# Patient Record
Sex: Female | Born: 1985 | Race: Black or African American | Hispanic: No | Marital: Single | Smoking: Never smoker
Health system: Southern US, Community
[De-identification: ages and names within clinical notes are randomized; demographics above are authoritative.]

---

## 2010-10-09 ENCOUNTER — Other Ambulatory Visit (HOSPITAL_COMMUNITY)
Admission: RE | Admit: 2010-10-09 | Discharge: 2010-10-09 | Disposition: A | Discharge status: Home / Self Care | Payer: 59 | Source: Ambulatory Visit | Attending: Obstetrics and Gynecology | Admitting: Obstetrics and Gynecology

## 2010-10-09 DIAGNOSIS — Z01419 Encounter for gynecological examination (general) (routine) without abnormal findings: Secondary | ICD-10-CM | POA: Insufficient documentation

## 2010-10-09 DIAGNOSIS — Z113 Encounter for screening for infections with a predominantly sexual mode of transmission: Secondary | ICD-10-CM | POA: Insufficient documentation

## 2011-11-13 ENCOUNTER — Other Ambulatory Visit (HOSPITAL_COMMUNITY)
Admission: RE | Admit: 2011-11-13 | Discharge: 2011-11-13 | Disposition: A | Discharge status: Home / Self Care | Payer: 59 | Source: Ambulatory Visit | Attending: Obstetrics and Gynecology | Admitting: Obstetrics and Gynecology

## 2011-11-13 DIAGNOSIS — Z113 Encounter for screening for infections with a predominantly sexual mode of transmission: Secondary | ICD-10-CM | POA: Insufficient documentation

## 2011-11-13 DIAGNOSIS — Z01419 Encounter for gynecological examination (general) (routine) without abnormal findings: Secondary | ICD-10-CM | POA: Insufficient documentation

## 2012-09-03 ENCOUNTER — Encounter (HOSPITAL_COMMUNITY): Payer: Self-pay | Admitting: *Deleted

## 2012-09-03 ENCOUNTER — Emergency Department (HOSPITAL_COMMUNITY)
Admission: EM | Admit: 2012-09-03 | Discharge: 2012-09-03 | Disposition: A | Discharge status: Home / Self Care | Payer: 59 | Attending: Emergency Medicine | Admitting: Emergency Medicine

## 2012-09-03 DIAGNOSIS — G8929 Other chronic pain: Secondary | ICD-10-CM

## 2012-09-03 DIAGNOSIS — R110 Nausea: Secondary | ICD-10-CM | POA: Insufficient documentation

## 2012-09-03 DIAGNOSIS — Z3202 Encounter for pregnancy test, result negative: Secondary | ICD-10-CM | POA: Insufficient documentation

## 2012-09-03 DIAGNOSIS — R141 Gas pain: Secondary | ICD-10-CM | POA: Insufficient documentation

## 2012-09-03 DIAGNOSIS — J3489 Other specified disorders of nose and nasal sinuses: Secondary | ICD-10-CM | POA: Insufficient documentation

## 2012-09-03 DIAGNOSIS — R1084 Generalized abdominal pain: Secondary | ICD-10-CM | POA: Insufficient documentation

## 2012-09-03 DIAGNOSIS — R142 Eructation: Secondary | ICD-10-CM | POA: Insufficient documentation

## 2012-09-03 LAB — URINALYSIS, ROUTINE W REFLEX MICROSCOPIC
Bilirubin Urine: NEGATIVE
Glucose, UA: NEGATIVE mg/dL
Hgb urine dipstick: NEGATIVE
Ketones, ur: 15 mg/dL — AB
Nitrite: NEGATIVE
Protein, ur: NEGATIVE mg/dL
Specific Gravity, Urine: 1.026 (ref 1.005–1.030)
Urobilinogen, UA: 1 mg/dL (ref 0.0–1.0)
pH: 6 (ref 5.0–8.0)

## 2012-09-03 LAB — COMPREHENSIVE METABOLIC PANEL WITH GFR
ALT: 12 U/L (ref 0–35)
Alkaline Phosphatase: 90 U/L (ref 39–117)
CO2: 29 meq/L (ref 19–32)
Chloride: 102 meq/L (ref 96–112)
GFR calc Af Amer: >90 mL/min (ref >90)
GFR calc non Af Amer: >90 mL/min (ref >90)
Glucose, Bld: 72 mg/dL (ref 70–99)
Potassium: 4.1 meq/L (ref 3.5–5.1)
Sodium: 140 meq/L (ref 135–145)
Total Bilirubin: 0.4 mg/dL (ref 0.3–1.2)

## 2012-09-03 LAB — COMPREHENSIVE METABOLIC PANEL
AST: 20 U/L (ref 0–37)
Albumin: 4.1 g/dL (ref 3.5–5.2)
BUN: 8 mg/dL (ref 6–23)
Calcium: 9.7 mg/dL (ref 8.4–10.5)
Creatinine, Ser: 0.67 mg/dL (ref 0.50–1.10)
Total Protein: 7.7 g/dL (ref 6.0–8.3)

## 2012-09-03 LAB — CBC WITH DIFFERENTIAL/PLATELET
Basophils Absolute: 0 10*3/uL (ref 0.0–0.1)
Basophils Relative: 0 % (ref 0–1)
Eosinophils Absolute: 0.2 10*3/uL (ref 0.0–0.7)
Eosinophils Relative: 4 % (ref 0–5)
HCT: 39.1 % (ref 36.0–46.0)
Hemoglobin: 13.4 g/dL (ref 12.0–15.0)
Lymphocytes Relative: 46 % (ref 12–46)
Lymphs Abs: 2.2 K/uL (ref 0.7–4.0)
MCH: 28.8 pg (ref 26.0–34.0)
MCHC: 34.3 g/dL (ref 30.0–36.0)
MCV: 84.1 fL (ref 78.0–100.0)
Monocytes Absolute: 0.4 10*3/uL (ref 0.1–1.0)
Monocytes Relative: 9 % (ref 3–12)
Neutro Abs: 2 10*3/uL (ref 1.7–7.7)
Neutrophils Relative %: 42 % — ABNORMAL LOW (ref 43–77)
Platelets: 299 K/uL (ref 150–400)
RBC: 4.65 MIL/uL (ref 3.87–5.11)
RDW: 13.3 % (ref 11.5–15.5)
WBC: 4.8 K/uL (ref 4.0–10.5)

## 2012-09-03 LAB — URINE MICROSCOPIC-ADD ON

## 2012-09-03 LAB — POCT PREGNANCY, URINE: Preg Test, Ur: NEGATIVE

## 2012-09-03 LAB — LIPASE, BLOOD: Lipase: 43 U/L (ref 11–59)

## 2012-09-03 NOTE — ED Notes (Addendum)
Pt asking how much longer that she needs to wait she would like to go down to the cafeteria because she is hungry. Pt informed not to eat until ok'd by the doctor.

## 2012-09-03 NOTE — ED Notes (Signed)
Pt states has seen multiple doctors urgent cares for abdominal pain, told she may have fibroids due to the way her stomach is shaped. Pt reports bilateral pain to abdomen x this week. Reports back pain, nausea.

## 2012-09-03 NOTE — ED Provider Notes (Signed)
I reviewed the case with the resident, reviewed the resident's note and I agree with the findings and plan.  Hurman Horn, MD 09/03/12 2149

## 2012-09-03 NOTE — ED Provider Notes (Signed)
History     CSN: [id number]  Arrival date & time 09/03/12  1018   First MD Initiated Contact with Patient 09/03/12 1127      Chief Complaint  Patient presents with  . Abdominal Pain    (Consider location/radiation/quality/duration/timing/severity/associated sxs/prior treatment) Patient is a 27 y.o. female presenting with abdominal pain. The history is provided by the patient. No language interpreter was used.  Abdominal Pain Pain location:  Generalized Pain quality: cramping   Pain radiates to:  Does not radiate Pain severity:  Moderate Onset quality:  Gradual Duration: 2-3 days occuring about monthly. Timing:  Intermittent Progression:  Waxing and waning Chronicity:  Chronic (5-6 years) Context: not diet changes, not eating, not laxative use, not recent illness, not recent sexual activity, not retching, not sick contacts, not suspicious food intake and not trauma   Relieved by:  Nothing Worsened by:  Nothing tried Ineffective treatments:  None tried Associated symptoms: nausea   Associated symptoms: no anorexia, no chest pain, no chills, no constipation, no cough, no diarrhea, no dysuria, no fatigue, no fever, no shortness of breath, no sore throat, no vaginal bleeding, no vaginal discharge and no vomiting   Risk factors: no alcohol abuse, no aspirin use, not elderly, has not had multiple surgeries, no NSAID use, not obese, not pregnant and no recent hospitalization     History reviewed. No pertinent past medical history.  History reviewed. No pertinent past surgical history.  No family history on file.  History  Substance Use Topics  . Smoking status: Never Smoker   . Smokeless tobacco: Not on file  . Alcohol Use: No    OB History   Grav Para Term Preterm Abortions TAB SAB Ect Mult Living                  Review of Systems  Constitutional: Negative for fever, chills, diaphoresis, activity change, appetite change and fatigue.  HENT: Negative for congestion,  sore throat, facial swelling, rhinorrhea, neck pain and neck stiffness.   Eyes: Negative for photophobia and discharge.  Respiratory: Negative for cough, chest tightness and shortness of breath.   Cardiovascular: Negative for chest pain, palpitations and leg swelling.  Gastrointestinal: Positive for nausea and abdominal pain. Negative for vomiting, diarrhea, constipation and anorexia.  Endocrine: Negative for polydipsia and polyuria.  Genitourinary: Negative for dysuria, frequency, vaginal bleeding, vaginal discharge, difficulty urinating and pelvic pain.  Musculoskeletal: Negative for back pain and arthralgias.  Skin: Negative for color change and wound.  Allergic/Immunologic: Negative for immunocompromised state.  Neurological: Negative for facial asymmetry, weakness, numbness and headaches.  Hematological: Does not bruise/bleed easily.  Psychiatric/Behavioral: Negative for confusion and agitation.    Allergies  Review of patient's allergies indicates no known allergies.  Home Medications   Current Outpatient Rx  Name  Route  Sig  Dispense  Refill  . clindamycin (CLEOCIN) 150 MG capsule   Oral   Take 150 mg by mouth 3 (three) times daily.         . Pseudoeph-Doxylamine-DM-APAP (NYQUIL PO)   Oral   Take 30 mLs by mouth once.           BP 127/84  Pulse 81  Temp(Src) 98 F (36.7 C) (Oral)  Resp 16  SpO2 100%  Physical Exam  Constitutional: She is oriented to person, place, and time. She appears well-developed and well-nourished. No distress.  HENT:  Head: Normocephalic and atraumatic.  Mouth/Throat: No oropharyngeal exudate.  Eyes: Pupils are equal, round, and reactive  to light.  Neck: Normal range of motion. Neck supple.  Cardiovascular: Normal rate, regular rhythm and normal heart sounds.  Exam reveals no gallop and no friction rub.   No murmur heard. Pulmonary/Chest: Effort normal and breath sounds normal. No respiratory distress. She has no wheezes. She has no  rales.  Abdominal: Soft. Bowel sounds are normal. She exhibits distension. She exhibits no mass. There is no tenderness. There is no rebound and no guarding.  Musculoskeletal: Normal range of motion. She exhibits no edema and no tenderness.  Neurological: She is alert and oriented to person, place, and time.  Skin: Skin is warm and dry.  Psychiatric: She has a normal mood and affect.    ED Course  Procedures (including critical care time)  Labs Reviewed  URINALYSIS, ROUTINE W REFLEX MICROSCOPIC - Abnormal; Notable for the following:    APPearance CLOUDY (*)    Ketones, ur 15 (*)    Leukocytes, UA SMALL (*)    All other components within normal limits  CBC WITH DIFFERENTIAL - Abnormal; Notable for the following:    Neutrophils Relative 42 (*)    All other components within normal limits  URINE MICROSCOPIC-ADD ON - Abnormal; Notable for the following:    Squamous Epithelial / LPF FEW (*)    Crystals CA OXALATE CRYSTALS (*)    All other components within normal limits  COMPREHENSIVE METABOLIC PANEL  LIPASE, BLOOD  POCT PREGNANCY, URINE   No results found.   1. Abdominal pain, chronic, generalized       MDM   Pt is a 27 y.o. female with no pertinent PMHX who presents with 5-6 years of intermittent crampy generalized abdominal pain usually w/o associated symptoms, but pt had nausea yesterday, rhinorrhea & congestion 2 days ago, and low back pain last night.  No symptoms currently.  Pt states she came for and [place] see if she had fibroids.  She was sent by [facility], but had also called her gynecologist today and did not feel her wait time was acceptable for an appointment.  On PE, VSS, pt in NAD.  Abdomen is protuberant, but non tender.  CBC, CMP, lipase, UA and POC preg unremarkable.  I doubt acute medical emergency or surgical condition.  Pt may indeed have fibroids, but I believe w/u is safe to continue as an outpt.  Appointment made in 1 week.  Return precautions given for new or  worsening symptoms.     1. Abdominal pain, chronic, generalized      Labs and imaging considered in decision making, reviewed by myself.  Imaging interpreted by radiology. Pt care discussed with my attending, Dr. Fonnie Jarvis.         Toy Cookey, MD 09/03/12 [phone]

## 2012-09-03 NOTE — ED Provider Notes (Addendum)
I saw and evaluated the patient, reviewed the resident's note and I agree with the findings and plan.  Patient with prior history of gynecologic issues reports in acute on chronic discomfort in the lower abdomen and other concerns. She contacted her usual OB/GYN with fecal and was told that there were no availability until next week but did not suggest on where she should go next. Her father suggested that she go to the [facility] urgent care who apparently redirected her here to the emergency department. I reviewed [facility] notes and fine no notes from the urgent care time unsure exactly what the clinical suspicions were. The patient has a soft abdomen with no guarding or rebound. She reports her pain is improved with time. She denies fever or chills, vaginal bleeding or discharge. I feel the patient can be scheduled for an outpatient ultrasound and can followup with her OB/GYN next week. I suggested that she can return if symptoms worsen or she could go to [facility] as many clinic for further more urgent evaluation if she so desired. Patient and family understand.  I spoke to APP provider at [facility] to inform her of patient going to [facility] from here most likely.  Gavin Pound. Oletta Lamas, MD 09/03/12 1326  Gavin Pound. Ghim, MD 09/03/12 1327

## 2012-09-03 NOTE — ED Notes (Signed)
Verified with MD that pt needs to follow up with appointment and not go straight to [facility] ED. Pt alert and mentating appropriately upon d/c. Pt given d/c teaching and follow up care. Pt verbalizes understanding has no further questions upon d/c. NAD noted upon d/c. Pt ambulatory leaving with d/c teaching.

## 2013-01-08 ENCOUNTER — Encounter (HOSPITAL_COMMUNITY): Payer: Self-pay | Admitting: *Deleted

## 2013-01-08 ENCOUNTER — Emergency Department (HOSPITAL_COMMUNITY)
Admission: EM | Admit: 2013-01-08 | Discharge: 2013-01-08 | Disposition: A | Discharge status: Home / Self Care | Payer: No Typology Code available for payment source | Attending: Emergency Medicine | Admitting: Emergency Medicine

## 2013-01-08 DIAGNOSIS — Y92410 Unspecified street and highway as the place of occurrence of the external cause: Secondary | ICD-10-CM | POA: Insufficient documentation

## 2013-01-08 DIAGNOSIS — R5381 Other malaise: Secondary | ICD-10-CM | POA: Insufficient documentation

## 2013-01-08 DIAGNOSIS — Y9389 Activity, other specified: Secondary | ICD-10-CM | POA: Insufficient documentation

## 2013-01-08 MED ORDER — CYCLOBENZAPRINE HCL 10 MG PO TABS: 10.0000 mg | ORAL_TABLET | Freq: Two times a day (BID) | ORAL | Status: DC | PRN

## 2013-01-08 MED ORDER — TRAMADOL HCL 50 MG PO TABS: 50.0000 mg | ORAL_TABLET | Freq: Four times a day (QID) | ORAL | Status: DC | PRN
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
## 2013-01-12 NOTE — ED Provider Notes (Signed)
Medical screening examination/treatment/procedure(s) were performed by non-physician practitioner and as supervising physician I was immediately available for consultation/collaboration.    Jayvian Escoe J. Kirsty Monjaraz, MD 01/12/13 1556 

## 2013-01-14 ENCOUNTER — Emergency Department (HOSPITAL_COMMUNITY)
Admission: EM | Admit: 2013-01-14 | Discharge: 2013-01-14 | Disposition: A | Discharge status: Home / Self Care | Payer: Self-pay | Attending: Emergency Medicine | Admitting: Emergency Medicine

## 2013-01-14 ENCOUNTER — Emergency Department (HOSPITAL_COMMUNITY): Payer: Self-pay

## 2013-01-14 ENCOUNTER — Encounter (HOSPITAL_COMMUNITY): Payer: Self-pay | Admitting: Emergency Medicine

## 2013-01-14 DIAGNOSIS — J029 Acute pharyngitis, unspecified: Secondary | ICD-10-CM | POA: Insufficient documentation

## 2013-01-14 DIAGNOSIS — R1110 Vomiting, unspecified: Secondary | ICD-10-CM | POA: Insufficient documentation

## 2013-01-14 DIAGNOSIS — J069 Acute upper respiratory infection, unspecified: Secondary | ICD-10-CM | POA: Insufficient documentation

## 2013-01-14 MED ORDER — ACETAMINOPHEN 325 MG PO TABS
650.0000 mg | ORAL_TABLET | Freq: Once | ORAL | Status: AC
Start: 2013-01-14 — End: 2013-01-14
  Administered 2013-01-14: 650 mg via ORAL
  Filled 2013-01-14: qty 2

## 2013-01-14 MED ORDER — BENZONATATE 100 MG PO CAPS: 100.0000 mg | ORAL_CAPSULE | Freq: Three times a day (TID) | ORAL | Status: DC

## 2013-01-14 NOTE — ED Provider Notes (Signed)
Medical screening examination/treatment/procedure(s) were performed by non-physician practitioner and as supervising physician I was immediately available for consultation/collaboration.  Miner Koral R. Bassem Bernasconi, MD 01/14/13 2346 
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
## 2013-01-14 NOTE — ED Provider Notes (Signed)
History  This chart was scribed for [employer] working with Juliet Rude. Pickering, MD. This patient was seen in room WTR7/WTR7 and the patient's care was started at 7:59 PM.  CSN: [id number]  Arrival date & time 01/14/13  1957   Chief Complaint  Patient presents with  . Cough    Patient is a 27 y.o. female presenting with cough. The history is provided by the patient. No language interpreter was used.  Cough Cough characteristics:  Productive Sputum characteristics:  Clear (thick) Severity:  Moderate Onset quality:  Gradual Duration:  4 days Timing:  Intermittent Progression:  Worsening Chronicity:  New Smoker: no (exposed to second-hand smoke)   Context: sick contacts and smoke exposure   Relieved by:  None tried Worsened by:  Nothing tried Ineffective treatments:  None tried Associated symptoms: sore throat   Associated symptoms: no chest pain, no chills, no diaphoresis, no fever, no headaches, no myalgias, no rash, no rhinorrhea, no shortness of breath and no wheezing   Sore throat:    Severity:  Mild   Onset quality:  Gradual   Duration:  4 days   Timing:  Constant   Progression:  Unchanged  HPI Comments: Denise Mccoy is a 27 y.o. female without significant PMH brought by EMS from [facility] to the Emergency Department complaining of a cough of 4 days duration. She states that her cough is productive of clear, thick mucus. Denies hemoptysis.  She reports an associated sore throat and post-tussive emesis. Pt states that she lives in a homeless shelter and many others whom she has been in close proximity to have had similar symptoms. Pt denies taking OTC medications at home to improve symptoms. Pt denies fever, SOB, congestion, rhinorrhea, headaches or any other symptoms. Pt denies alcohol use and denies any hx of smoking.  PCP- None  History reviewed. No pertinent past medical history.  History reviewed. No pertinent past surgical history.  No family history  on file.  History  Substance Use Topics  . Smoking status: Never Smoker   . Smokeless tobacco: Not on file  . Alcohol Use: No   OB History   Grav Para Term Preterm Abortions TAB SAB Ect Mult Living                 Review of Systems  Constitutional: Negative for fever, chills and diaphoresis.  HENT: Positive for sore throat. Negative for congestion and rhinorrhea.   Respiratory: Positive for cough. Negative for shortness of breath and wheezing.   Cardiovascular: Negative for chest pain.  Musculoskeletal: Negative for myalgias.  Skin: Negative for rash.  Neurological: Negative for headaches.  All other systems reviewed and are negative.    Allergies  Review of patient's allergies indicates no known allergies.  Home Medications   Current Outpatient Rx  Name  Route  Sig  Dispense  Refill  . cyclobenzaprine (FLEXERIL) 10 MG tablet   Oral   Take 1 tablet (10 mg total) by mouth 2 (two) times daily as needed for muscle spasms.   20 tablet   0   . traMADol (ULTRAM) 50 MG tablet   Oral   Take 1 tablet (50 mg total) by mouth every 6 (six) hours as needed for pain.   15 tablet   0    Triage Vitals: BP 142/94  Pulse 111  Temp(Src) 98.3 F (36.8 C) (Oral)  Resp 20  Wt 150 lb (68.04 kg)  SpO2 97%  Physical Exam  Nursing note and  vitals reviewed. Constitutional: She is oriented to person, place, and time. She appears well-developed and well-nourished. No distress.  HENT:  Head: Normocephalic and atraumatic.  Right Ear: External ear normal.  Left Ear: External ear normal.  Nose: Nose normal.  Mouth/Throat: Oropharynx is clear and moist. No oropharyngeal exudate.  Nasal mucosal edema.  Eyes: EOM are normal.  Neck: Normal range of motion.  Cardiovascular: Normal rate, regular rhythm and normal heart sounds.   Pulmonary/Chest: Effort normal and breath sounds normal. No respiratory distress.  Abdominal: Soft. She exhibits no distension. There is no tenderness.   Musculoskeletal: Normal range of motion.  Neurological: She is alert and oriented to person, place, and time.  Skin: Skin is warm and dry.  Psychiatric: She has a normal mood and affect. Judgment normal.    ED Course  Procedures (including critical care time)  DIAGNOSTIC STUDIES: Oxygen Saturation is 97% on RA, normal by my interpretation.    COORDINATION OF CARE: 9:40 PM- Pt advised of plan for treatment and pt agrees.    Labs Reviewed - No data to display  Dg Chest 2 View  01/14/2013   *RADIOLOGY REPORT*  Clinical Data: Cough and congestion  CHEST - 2 VIEW  Comparison: None.  Findings: Cardiomediastinal silhouette is within normal limits. The lungs are clear. No pleural effusion.  No pneumothorax.  No acute osseous abnormality.  IMPRESSION: Normal chest.   Original Report Authenticated By: Christiana Pellant, M.D.    1. URI (upper respiratory infection)     MDM  Pt CXR negative for acute infiltrate. Patients symptoms are consistent with URI, likely viral etiology. Discussed that antibiotics are not indicated for viral infections. Pt will be discharged with symptomatic treatment.  Verbalizes understanding and is agreeable with plan. Pt is hemodynamically stable & in NAD prior to dc.  I personally performed the services described in this documentation, which was scribed in my presence. The recorded information has been reviewed and is accurate.    Pascal Lux [place], PA-C 01/14/13 2310

## 2013-01-14 NOTE — ED Notes (Signed)
Patient with sore throat and cough since Sunday.  Vomiting mucus sometimes when she coughs.  Lives in a homeless shelter and says many there have been sick with the same symptoms.

## 2013-03-31 ENCOUNTER — Encounter: Payer: Self-pay | Admitting: *Deleted

## 2013-04-06 ENCOUNTER — Encounter: Payer: Self-pay | Admitting: Medical

## 2013-04-09 ENCOUNTER — Encounter: Payer: Self-pay | Admitting: *Deleted

## 2013-04-21 ENCOUNTER — Encounter: Payer: Self-pay | Admitting: *Deleted

## 2013-05-17 ENCOUNTER — Encounter: Payer: Self-pay | Admitting: Medical

## 2013-05-17 ENCOUNTER — Ambulatory Visit (INDEPENDENT_AMBULATORY_CARE_PROVIDER_SITE_OTHER): Payer: Self-pay | Admitting: Medical

## 2013-05-17 VITALS — BP 130/88 | HR 78 | Temp 97.4°F | Ht 65.0 in | Wt 150.1 lb

## 2013-05-17 DIAGNOSIS — Z0000 Encounter for general adult medical examination without abnormal findings: Secondary | ICD-10-CM

## 2013-05-17 DIAGNOSIS — Z01419 Encounter for gynecological examination (general) (routine) without abnormal findings: Secondary | ICD-10-CM

## 2013-05-17 DIAGNOSIS — N899 Noninflammatory disorder of vagina, unspecified: Secondary | ICD-10-CM

## 2013-05-17 DIAGNOSIS — N898 Other specified noninflammatory disorders of vagina: Secondary | ICD-10-CM

## 2013-05-17 NOTE — Progress Notes (Signed)
Patient ID: Denise Mccoy, female   DOB: May 18, 1986, 27 y.o.   MRN: [id number]  History:  Ms. Denise Mccoy is a 27 y.o. No obstetric history on file. who presents to clinic today for genital warts. Referred from the [facility]. No concerns and issues today.    The following portions of the patient's history were reviewed and updated as appropriate: allergies, current medications, past family history, past medical history, past social history, past surgical history and problem list.  Review of Systems:  Pertinent items are noted in HPI.  Objective:  Physical Exam BP 130/88  Pulse 78  Temp(Src) 97.4 F (36.3 C) (Oral)  Ht 5\' 5"  (1.651 m)  Wt 150 lb 1.6 oz (68.085 kg)  BMI 24.98 kg/m2  LMP 03/07/2013 GENERAL: Well-developed, well-nourished female in no acute distress.  HEENT: Normocephalic, atraumatic.  NECK: Supple. Normal thyroid.  LUNGS: Normal rate. Clear to auscultation bilaterally.  HEART: Regular rate and rhythm with no adventitious sounds.  BREASTS: Symmetric in size. No masses, skin changes, nipple drainage, or lymphadenopathy. ABDOMEN: Soft, nontender, nondistended. No organomegaly. Normal bowel sounds appreciated in all quadrants.  PELVIC: Normal external female genitalia. Vagina is pink and rugated.  Normal discharge. Normal cervix contour. Pap smear obtained. Uterus is normal in size. No adnexal mass or tenderness.  EXTREMITIES: No cyanosis, clubbing, or edema, 2+ distal pulses.   Labs and Imaging No results found.  Assessment & Plan:  Assessment: Normal female exam  Plans: 1. Patient to monitor for sign/symptoms of genital warts or other outbreak 2. Patient to call for appointment if "warts" return  Freddi Starr, PA-C 05/17/2013 2:13 PM

## 2013-05-17 NOTE — Patient Instructions (Signed)
Genital Warts  Genital warts are caused by a germ (human papillomavirus, HPV). This germ is spread by having unprotected sex (intercourse) with an infected person. It can be spread by vaginal, anal, and oral sex. A person who is infected might not show any signs or problems.  HOME CARE  · Follow your doctor's treatment instructions.  · Do not use medicine that is meant for hand warts. Only take medicine as told by your doctor.  · Tell your past and current sex partner(s) that you have genital warts. They may need treatment.  · Avoid sexual contact while you are being treated.  · Do not touch or scratch the warts. You could spread it to other parts of your body.  · Women with genital warts should have a cervical cancer check (Pap test) at least once a year.  · Tell your doctor if you become pregnant. The germ can be passed to the baby.  · After treatment, use a condom during sex.  · Ask your doctor before using anti-itch creams.  GET HELP RIGHT AWAY IF:   · Your treated skin becomes red, puffy (swollen), or painful.  · You have a fever.  · You feel generally sick.  · You feel little lumps in and around your genital area.  · You are bleeding or have pain during sex.  MAKE SURE YOU:   · Understand these instructions.  · Will watch your condition.  · Will get help right away if you are not doing well or get worse.  Document Released: 09/11/2009 Document Revised: 09/09/2011 Document Reviewed: 12/24/2010  [employer]® Patient Information ©2014 [employer].

## 2013-09-16 ENCOUNTER — Other Ambulatory Visit (HOSPITAL_COMMUNITY)
Admission: RE | Admit: 2013-09-16 | Discharge: 2013-09-16 | Disposition: A | Discharge status: Home / Self Care | Payer: No Typology Code available for payment source | Source: Ambulatory Visit | Attending: Nurse Practitioner | Admitting: Nurse Practitioner

## 2013-09-16 ENCOUNTER — Other Ambulatory Visit: Payer: Self-pay | Admitting: Nurse Practitioner

## 2013-09-16 DIAGNOSIS — Z01419 Encounter for gynecological examination (general) (routine) without abnormal findings: Secondary | ICD-10-CM | POA: Insufficient documentation

## 2013-09-16 DIAGNOSIS — Z113 Encounter for screening for infections with a predominantly sexual mode of transmission: Secondary | ICD-10-CM | POA: Insufficient documentation

## 2013-09-17 ENCOUNTER — Other Ambulatory Visit: Payer: Self-pay | Admitting: Gastroenterology

## 2013-09-17 DIAGNOSIS — R140 Abdominal distension (gaseous): Secondary | ICD-10-CM

## 2013-09-23 ENCOUNTER — Other Ambulatory Visit: Payer: Self-pay

## 2014-02-03 ENCOUNTER — Emergency Department (INDEPENDENT_AMBULATORY_CARE_PROVIDER_SITE_OTHER)
Admission: EM | Admit: 2014-02-03 | Discharge: 2014-02-03 | Disposition: A | Discharge status: Home / Self Care | Payer: No Typology Code available for payment source | Source: Home / Self Care | Attending: Family Medicine | Admitting: Family Medicine

## 2014-02-03 ENCOUNTER — Encounter (HOSPITAL_COMMUNITY): Payer: Self-pay | Admitting: Emergency Medicine

## 2014-02-03 DIAGNOSIS — K5289 Other specified noninfective gastroenteritis and colitis: Secondary | ICD-10-CM

## 2014-02-03 DIAGNOSIS — K529 Noninfective gastroenteritis and colitis, unspecified: Secondary | ICD-10-CM

## 2014-02-03 MED ORDER — SODIUM CHLORIDE 0.9 % IV SOLN
Freq: Once | INTRAVENOUS | Status: AC
  Administered 2014-02-03: 20:00:00 via INTRAVENOUS

## 2014-02-03 MED ORDER — ONDANSETRON HCL 4 MG/2ML IJ SOLN
INTRAMUSCULAR | Status: AC
  Filled 2014-02-03: qty 2

## 2014-02-03 MED ORDER — ONDANSETRON HCL 4 MG/2ML IJ SOLN
4.0000 mg | Freq: Once | INTRAMUSCULAR | Status: AC
  Administered 2014-02-03: 4 mg via INTRAVENOUS

## 2014-02-03 MED ORDER — ONDANSETRON HCL 4 MG PO TABS: 4.0000 mg | ORAL_TABLET | Freq: Four times a day (QID) | ORAL | Status: AC

## 2014-02-03 NOTE — ED Provider Notes (Signed)
CSN: [id number]     Arrival date & time 02/03/14  1843 History   First MD Initiated Contact with Patient 02/03/14 1903     Chief Complaint  Patient presents with  . Emesis   (Consider location/radiation/quality/duration/timing/severity/associated sxs/prior Treatment) Patient is a 28 y.o. female presenting with vomiting. The history is provided by the patient.  Emesis Severity:  Moderate Duration:  1 day Timing:  Intermittent Number of daily episodes:  8 times today. Progression:  Unchanged Chronicity:  New Relieved by:  None tried Worsened by:  Nothing tried Associated symptoms: no abdominal pain, no chills, no diarrhea and no fever   Risk factors: no sick contacts, no suspect food intake and no travel to endemic areas     History reviewed. No pertinent past medical history. History reviewed. No pertinent past surgical history. No family history on file. History  Substance Use Topics  . Smoking status: Never Smoker   . Smokeless tobacco: Not on file  . Alcohol Use: No   OB History   Grav Para Term Preterm Abortions TAB SAB Ect Mult Living   0 0 0 0 0 0 0 0 0 0      Review of Systems  Constitutional: Negative.  Negative for chills.  HENT: Negative.   Gastrointestinal: Positive for nausea and vomiting. Negative for abdominal pain, diarrhea and blood in stool.  Musculoskeletal: Negative.   Skin: Negative.     Allergies  Review of patient's allergies indicates no known allergies.  Home Medications   Prior to Admission medications   Medication Sig Start Date End Date Taking? Authorizing Provider  Multiple Vitamins-Minerals (HAIR VITAMINS PO) Take by mouth.    Historical Provider, MD  ondansetron (ZOFRAN) 4 MG tablet Take 1 tablet (4 mg total) by mouth every 6 (six) hours. Prn n/v. 02/03/14   Linna HoffJames D Adonay Scheier, MD   BP 125/83  Pulse 70  Temp(Src) 97.8 F (36.6 C) (Oral)  Resp 16  SpO2 100% Physical Exam  Nursing note and vitals reviewed. Constitutional: She is oriented  to person, place, and time. She appears well-developed and well-nourished. No distress.  Abdominal: Soft. She exhibits no distension and no mass. Bowel sounds are decreased. There is no tenderness. There is no rigidity, no rebound, no guarding and no CVA tenderness.  Neurological: She is alert and oriented to person, place, and time.  Skin: Skin is warm and dry.    ED Course  Procedures (including critical care time) Labs Review Labs Reviewed - No data to display  Imaging Review No results found.   MDM   1. Gastroenteritis, acute    Sx much improved at time of d/c after ivf and med.    Linna HoffJames D Harrell Niehoff, MD 02/03/14 2020

## 2014-02-03 NOTE — Discharge Instructions (Signed)
Clear liquid diet tonight as tolerated, advance on fri as improved, use medicine as needed, return or see your doctor if any problems. °

## 2014-02-03 NOTE — ED Notes (Signed)
Pt reports vomiting since 0900 this am Has vomited x8 so far and feeling nauseas and dizzy Denies f/d, abd pain, HA Alert w/no signs of acute distress.

## 2014-02-03 NOTE — ED Notes (Signed)
Pt laid down on table as soon as came in room stated she needed to lay down or she would vomit again, was breathing heavily upon entrance, but seems calm at current moment in no distress.

## 2014-10-02 IMAGING — CR DG CHEST 2V
2 series · 2 of 2 positions shown · non-contrast
Comparison: None.

CLINICAL DATA: Cough and congestion

CHEST - 2 VIEW

[w chest pa]
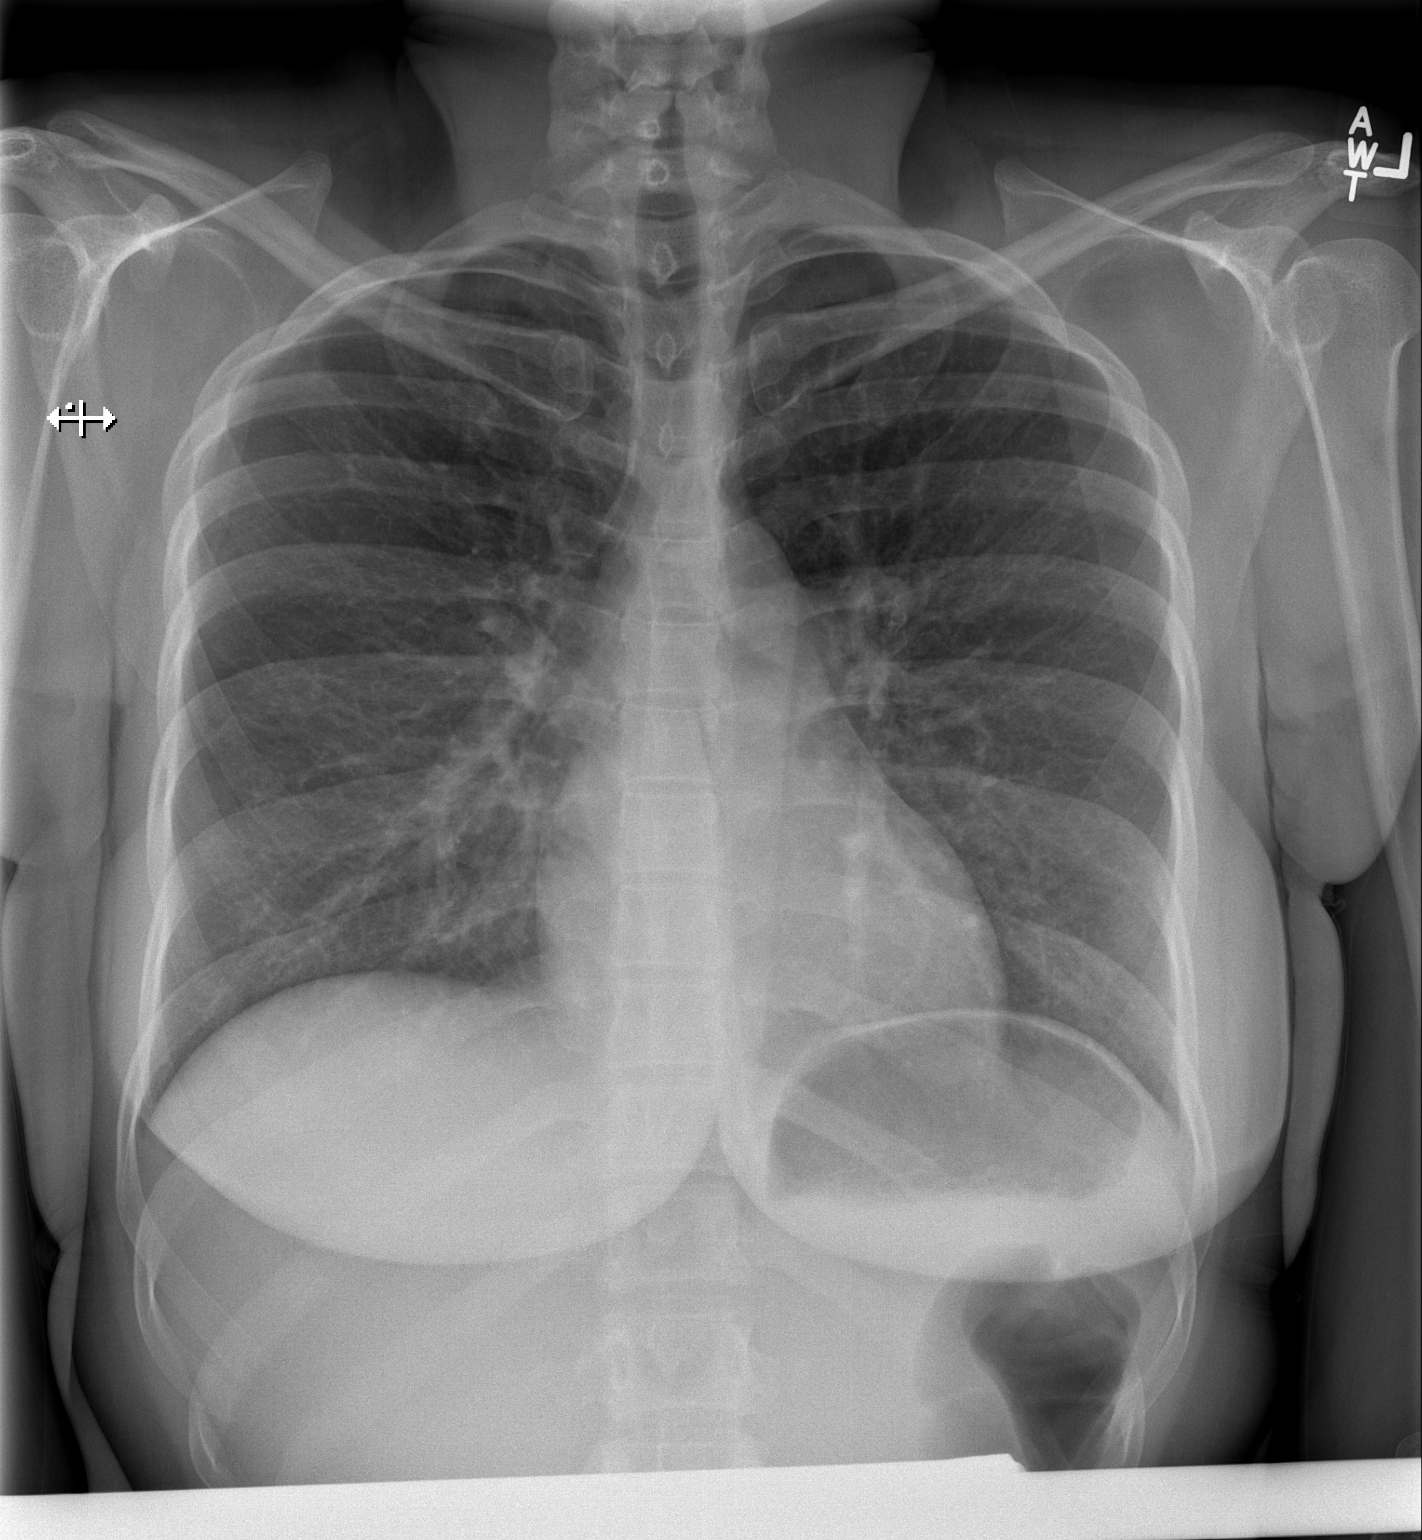

[w chest lat]
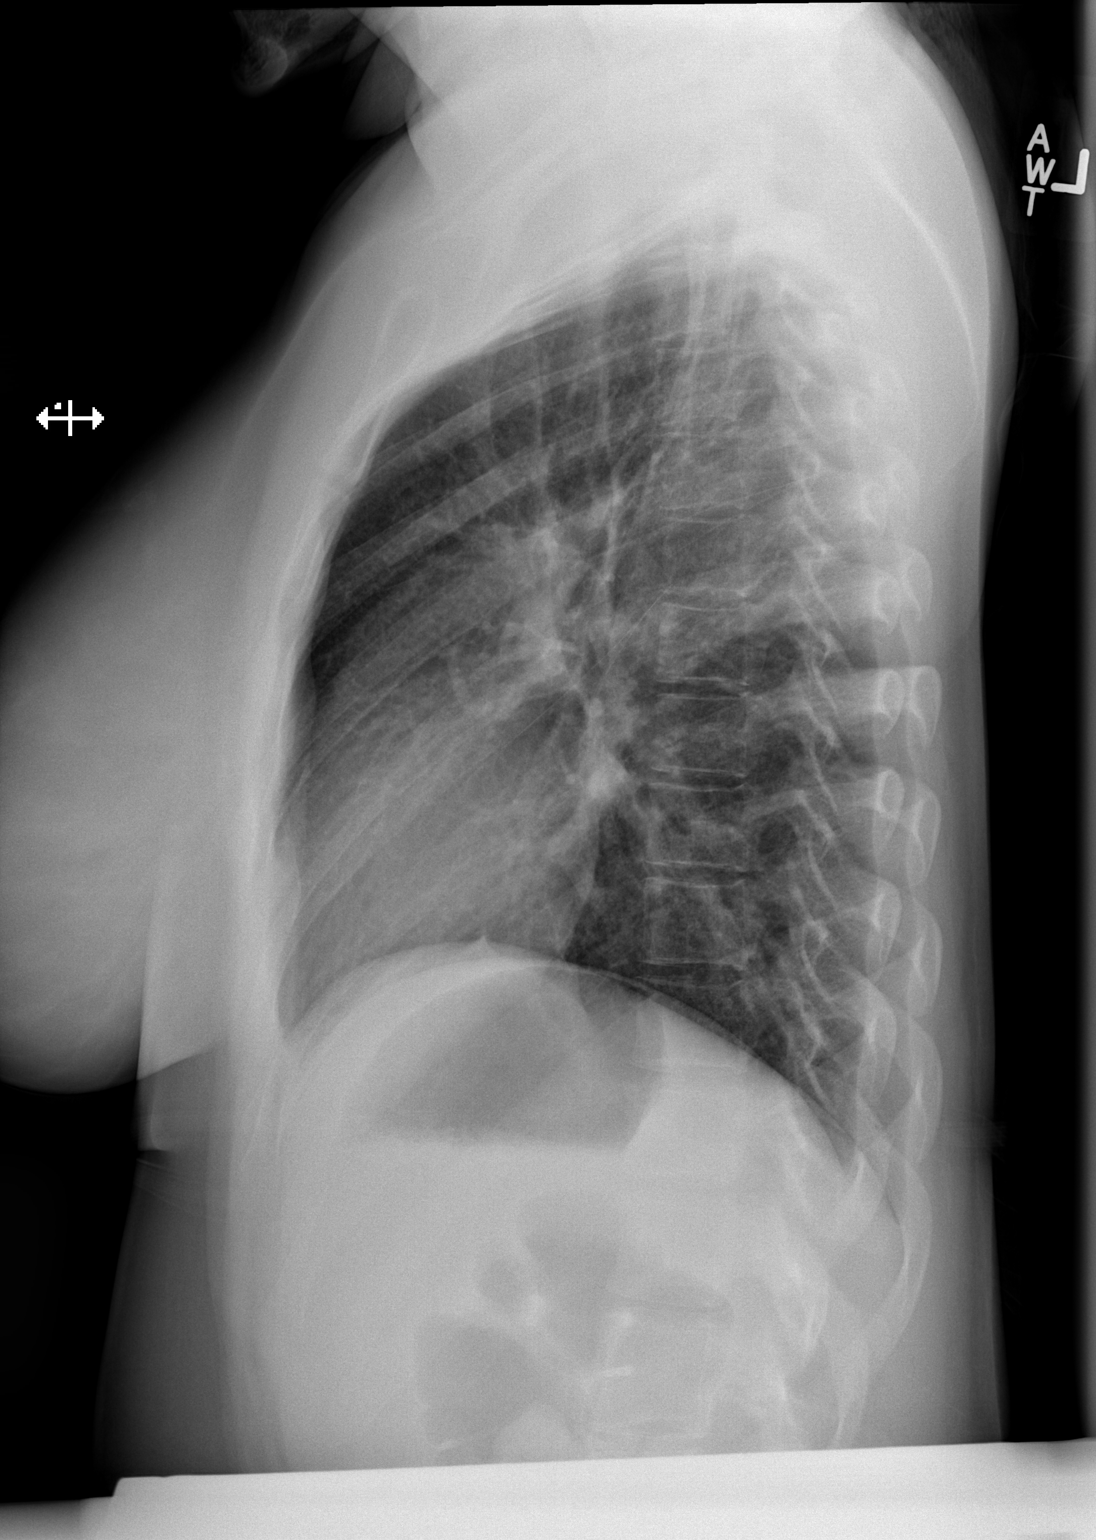

[2 of 2 positions shown; findings below may reference images not displayed]

FINDINGS: Cardiomediastinal silhouette is within normal limits. The
lungs are clear. No pleural effusion.  No pneumothorax.  No acute
osseous abnormality.
IMPRESSION: Normal chest.

## 2016-09-19 ENCOUNTER — Encounter: Payer: No Typology Code available for payment source | Admitting: Family Medicine
